# Patient Record
Sex: Female | Born: 1960 | Race: Black or African American | Hispanic: No | Marital: Single | Smoking: Current every day smoker
Health system: Southern US, Community
[De-identification: ages and names within clinical notes are randomized; demographics above are authoritative.]

## PROBLEM LIST (undated history)

## (undated) HISTORY — PX: APPENDECTOMY: SHX54

## (undated) HISTORY — PX: OVARY SURGERY: SHX727

## (undated) HISTORY — PX: SPLENECTOMY, PARTIAL: SHX787

---

## 2010-01-23 ENCOUNTER — Emergency Department (HOSPITAL_COMMUNITY): Admission: EM | Admit: 2010-01-23 | Discharge: 2010-01-23 | Payer: Self-pay | Admitting: Emergency Medicine

## 2011-11-12 ENCOUNTER — Encounter (HOSPITAL_COMMUNITY): Payer: Self-pay | Admitting: *Deleted

## 2011-11-12 ENCOUNTER — Emergency Department (INDEPENDENT_AMBULATORY_CARE_PROVIDER_SITE_OTHER): Payer: Self-pay

## 2011-11-12 ENCOUNTER — Emergency Department (INDEPENDENT_AMBULATORY_CARE_PROVIDER_SITE_OTHER)
Admission: EM | Admit: 2011-11-12 | Discharge: 2011-11-12 | Disposition: A | Discharge status: Home / Self Care | Payer: Self-pay | Source: Home / Self Care | Attending: Family Medicine | Admitting: Family Medicine

## 2011-11-12 DIAGNOSIS — J40 Bronchitis, not specified as acute or chronic: Secondary | ICD-10-CM

## 2011-11-12 MED ORDER — GUAIFENESIN-CODEINE 100-10 MG/5ML PO SYRP: 5.0000 mL | ORAL_SOLUTION | Freq: Four times a day (QID) | ORAL | Status: AC | PRN

## 2011-11-12 MED ORDER — TRIAMCINOLONE ACETONIDE 0.1 % EX OINT: TOPICAL_OINTMENT | Freq: Two times a day (BID) | CUTANEOUS | Status: AC

## 2011-11-12 MED ORDER — AZITHROMYCIN 250 MG PO TABS: 250.0000 mg | ORAL_TABLET | Freq: Every day | ORAL | Status: AC

## 2011-11-12 MED ORDER — ALBUTEROL SULFATE HFA 108 (90 BASE) MCG/ACT IN AERS: 2.0000 | INHALATION_SPRAY | RESPIRATORY_TRACT | Status: AC | PRN

## 2011-11-12 NOTE — ED Notes (Signed)
Pt  Reports  Symptoms  Of  Cough  /  Congested          Wheezing  Symptoms     Pain in her  Chest  Associated  With the  Coughing  -  She       Reports    Eyes          Are        Burning           As  Well    As  A  Runny  Nose          She  Is  Masked  And  Is  In a  Private  Room

## 2011-11-12 NOTE — Discharge Instructions (Signed)
Use albuterol every 4 to 6 hours for the next 24 to 48 hours, then as needed. Take antibiotics as directed, only if no improvement over the next 48 to 72 hours, or if fever returns. Use cough syrup as needed and as directed. I recommend aggressive fever control with acetaminophen (Tylenol) and/or ibuprofen. You may use these together, alternating them every 4 hours, or individually, every 8 hours. For example, take acetaminophen 500 to 1000 mg at 12 noon, then 600 to 800 mg of ibuprofen at 4 pm, then acetaminophen at 8 pm, etc. Also, stay hydrated with clear liquids. Return to care should your symptoms not improve, or worsen in any way

## 2011-11-12 NOTE — ED Notes (Signed)
Pt. came to registration desk and told the clerk the doctor did not give her a Rx. for the red spots on her legs. [facility] told me and I discussed with Dr. Juanetta Gosling. He said he forgot and did a Rx. for Triamcinolone 1% cream. I gave the Rx. to the pt. Vassie Moselle 11/12/2011

## 2011-11-12 NOTE — ED Provider Notes (Signed)
History     CSN: [id number]  Arrival date & time 11/12/11  1427   First MD Initiated Contact with Patient 11/12/11 1502      Chief Complaint  Patient presents with  . Cough    (Consider location/radiation/quality/duration/timing/severity/associated sxs/prior treatment) HPI Comments: Cheryl Serrano presents for evaluation of persistent cough, nasal congestion, and wheezing over the last few days. She smokes less than a pack of cigarettes a day. She denies any fever. She also reports pain in her chest with coughing and deep inspiration.  Patient is a 51 y.o. female presenting with cough. The history is provided by the patient.  Cough This is a new problem. The current episode started more than 2 days ago. The problem occurs constantly. The problem has not changed since onset.The cough is productive of sputum. Associated symptoms include chest pain, rhinorrhea, shortness of breath and wheezing. She is a smoker.    History reviewed. No pertinent past medical history.  Past Surgical History  Procedure Date  . Appendectomy   . Ovary surgery   . Splenectomy, partial     History reviewed. No pertinent family history.  History  Substance Use Topics  . Smoking status: Current Everyday Smoker  . Smokeless tobacco: Not on file  . Alcohol Use: Yes    OB History    Grav Para Term Preterm Abortions TAB SAB Ect Mult Living                  Review of Systems  Constitutional: Negative.   HENT: Positive for congestion and rhinorrhea.   Eyes: Negative.   Respiratory: Positive for cough, shortness of breath and wheezing.   Cardiovascular: Positive for chest pain.  Gastrointestinal: Negative.   Genitourinary: Negative.   Musculoskeletal: Negative.   Skin: Negative.   Neurological: Negative.     Allergies  Review of patient's allergies indicates no known allergies.  Home Medications   Current Outpatient Rx  Name Route Sig Dispense Refill  . ALBUTEROL SULFATE HFA 108 (90 BASE)  MCG/ACT IN AERS Inhalation Inhale 2 puffs into the lungs every 4 (four) hours as needed for wheezing or shortness of breath. 1 Inhaler 0  . AZITHROMYCIN 250 MG PO TABS Oral Take 1 tablet (250 mg total) by mouth daily. Take two tablets on first day, then one tablet each day for four days 6 tablet 0  . GUAIFENESIN-CODEINE 100-10 MG/5ML PO SYRP Oral Take 5 mLs by mouth every 6 (six) hours as needed for cough or congestion. 120 mL 0    BP 137/81  Pulse 90  Temp(Src) 98.2 F (36.8 C) (Oral)  Resp 22  SpO2 97%  Physical Exam  Nursing note and vitals reviewed. Constitutional: She is oriented to person, place, and time. She appears well-developed and well-nourished.  HENT:  Head: Normocephalic and atraumatic.  Right Ear: Tympanic membrane normal.  Left Ear: Tympanic membrane normal.  Mouth/Throat: Uvula is midline, oropharynx is clear and moist and mucous membranes are normal.  Eyes: EOM are normal.  Neck: Normal range of motion.  Cardiovascular: Normal rate and regular rhythm.   Pulmonary/Chest: Effort normal. She has no decreased breath sounds. She has wheezes in the right middle field and the right lower field. She has no rhonchi.  Musculoskeletal: Normal range of motion.  Neurological: She is alert and oriented to person, place, and time.  Skin: Skin is warm and dry.  Psychiatric: Her behavior is normal.    ED Course  Procedures (including critical care time)  Labs Reviewed -  No data to display Dg Chest 2 View  11/12/2011  *RADIOLOGY REPORT*  Clinical Data:  Cough, shortness of breath and tobacco use.  CHEST - 2 VIEW  Comparison: None  Findings: The heart size and mediastinal contours are within normal limits.  Both lungs are clear.  The visualized skeletal structures are unremarkable.  IMPRESSION: No active disease.  Original Report Authenticated By: Reola Calkins, M.D.     1. Bronchitis       MDM  Xray reviewed by radiologist and myself; no pneumonia; given rx for  albuterol, guaifenesin AC, and delayed rx for azithromycin        Renaee Munda, MD 11/12/11 1734

## 2017-03-02 ENCOUNTER — Emergency Department (HOSPITAL_COMMUNITY)
Admission: EM | Admit: 2017-03-02 | Discharge: 2017-03-02 | Disposition: A | Discharge status: Home / Self Care | Payer: Self-pay | Attending: Emergency Medicine | Admitting: Emergency Medicine

## 2017-03-02 ENCOUNTER — Encounter (HOSPITAL_COMMUNITY): Payer: Self-pay

## 2017-03-02 DIAGNOSIS — R05 Cough: Secondary | ICD-10-CM | POA: Insufficient documentation

## 2017-03-02 DIAGNOSIS — F17200 Nicotine dependence, unspecified, uncomplicated: Secondary | ICD-10-CM | POA: Insufficient documentation

## 2017-03-02 DIAGNOSIS — J302 Other seasonal allergic rhinitis: Secondary | ICD-10-CM | POA: Insufficient documentation

## 2017-03-02 DIAGNOSIS — R0981 Nasal congestion: Secondary | ICD-10-CM | POA: Insufficient documentation

## 2017-03-02 DIAGNOSIS — Z79899 Other long term (current) drug therapy: Secondary | ICD-10-CM | POA: Insufficient documentation

## 2017-03-02 MED ORDER — FLUTICASONE PROPIONATE 50 MCG/ACT NA SUSP: 2.0000 | Freq: Every day | NASAL | 0 refills | Status: AC

## 2017-03-02 NOTE — ED Triage Notes (Signed)
Patient complains of coughing all night x 4 nights. States that she is coughing up sputum and now has developed sore throat and right ear pain. Cough resolves during the day. Low grade fever per patient, NAD

## 2017-03-02 NOTE — Discharge Instructions (Signed)
Use Flonase nasal spray for 2 or 3 weeks to improve your discomfort.  For cough congestion, use Robitussin-DM.  You can also consider taking an antihistamine such as Claritin to improve your discomfort.

## 2017-03-02 NOTE — ED Provider Notes (Signed)
[facility]-EMERGENCY DEPT Provider Note   CSN: [id number] Arrival date & time: 03/02/17  1419   By signing my name below, I, Cheryl Serrano, attest that this documentation has been prepared under the direction and in the presence of Cheryl Bale, MD. Electronically signed, Cheryl Serrano, ED Scribe. 03/02/17. 3:04 PM.  History   Chief Complaint Chief Complaint  Patient presents with  . cough/congestion/ sore throat   The history is provided by the patient and medical records. No language interpreter was used.    Cheryl Serrano is a 56 y.o. female presenting to the Emergency Department concerning sore throat x 3 days. Associated cough, rhinorrhea, postnasal drip, congestion, eye pain, ear pain onset this morning, sinus pressure and pain and headache. She also reports low grade fevers at home. Cough worse at night. Pt states she has taken robitussin and other OTC cough medications without relief. No daily medications or chronic health disorders, per pt. 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 No vomiting. No other complaints at this time.   History reviewed. No pertinent past medical history.  There are no active problems to display for this patient.   Past Surgical History:  Procedure Laterality Date  . APPENDECTOMY    . OVARY SURGERY    . SPLENECTOMY, PARTIAL      OB History    No data available       Home Medications    Prior to Admission medications   Medication Sig Start Date End Date Taking? Authorizing Provider  albuterol (PROVENTIL HFA;VENTOLIN HFA) 108 (90 BASE) MCG/ACT inhaler Inhale 2 puffs into the lungs every 4 (four) hours as needed for wheezing or shortness of breath. 11/12/11 11/11/12  Josefina Do., MD  fluticasone (FLONASE) 50 MCG/ACT nasal spray Place 2 sprays into both nostrils daily. 03/02/17   Cheryl Bale, MD    Family History No family history on file.  Social History Social History  Substance Use Topics  . Smoking  status: Current Every Day Smoker  . Smokeless tobacco: Never Used  . Alcohol use Yes     Allergies   Patient has no known allergies.   Review of Systems Review of Systems  Constitutional: Positive for fever (low grade). Negative for chills.  HENT: Positive for congestion, ear pain, postnasal drip, sinus pain, sinus pressure and sore throat. Negative for trouble swallowing.   Eyes: Positive for pain.  Respiratory: Positive for cough.   Neurological: Positive for headaches.  All other systems reviewed and are negative.    Physical Exam Updated Vital Signs BP (!) 150/100   Pulse 66   Temp 97.7 F (36.5 C) (Oral)   Resp 18   SpO2 98%   Physical Exam  Constitutional: She is oriented to person, place, and time. She appears well-developed and well-nourished.  HENT:  Head: Normocephalic and atraumatic.  Eyes: Pupils are equal, round, and reactive to light. Conjunctivae and EOM are normal.  Neck: Normal range of motion and phonation normal. Neck supple.  Cardiovascular: Normal rate and regular rhythm.   Pulmonary/Chest: Effort normal and breath sounds normal. She exhibits no tenderness.  Abdominal: Soft. She exhibits no distension. There is no tenderness. There is no guarding.  Musculoskeletal: Normal range of motion.  Neurological: She is alert and oriented to person, place, and time. She exhibits normal muscle tone.  Skin: Skin is warm and dry.  Psychiatric: She has a normal mood and affect. Her behavior is normal. Judgment and thought content normal.  Nursing  note and vitals reviewed.    ED Treatments / Results  DIAGNOSTIC STUDIES: Oxygen Saturation is 98% on RA, NL by my interpretation.    COORDINATION OF CARE: 3:02 PM-Discussed next steps with pt. Pt verbalized understanding and is agreeable with the plan. Pt prepared for d/c, advised of symptomatic care at home, and return precautions.    Labs (all labs ordered are listed, but only abnormal results are  displayed) Labs Reviewed - No data to display  EKG  EKG Interpretation None       Radiology No results found.  Procedures Procedures (including critical care time)  Medications Ordered in ED Medications - No data to display   Initial Impression / Assessment and Plan / ED Course  I have reviewed the triage vital signs and the nursing notes.  Pertinent labs & imaging results that were available during my care of the patient were reviewed by me and considered in my medical decision making (see chart for details).      Patient Vitals for the past 24 hrs:  BP Temp Temp src Pulse Resp SpO2  03/02/17 1428 (!) 150/100 97.7 F (36.5 C) Oral 66 18 98 %    Discharge- reevaluation with update and discussion. After initial assessment and treatment, an updated evaluation reveals no change in clinical status findings discussed with the patient and all questions were answered. Cheryl Serrano L    Medical screening examination/treatment/procedure(s) were performed by non-physician practitioner and as supervising physician I was immediately available for consultation/collaboration.   EKG Interpretation None       Final Clinical Impressions(s) / ED Diagnoses   Final diagnoses:  Seasonal allergic rhinitis, unspecified trigger   Evaluation consistent with allergic rhinitis with secondary drainage.  Doubt bacterial sinusitis or metabolic instability.  Nursing Notes Reviewed/ Care Coordinated Applicable Imaging Reviewed Interpretation of Laboratory Data incorporated into ED treatment  The patient appears reasonably screened and/or stabilized for discharge and I doubt any other medical condition or other [facility] requiring further screening, evaluation, or treatment in the ED at this time prior to discharge.  Plan: Home Medications-OTC analgesia; Home Treatments-rest, fluids; return here if the recommended treatment, does not improve the symptoms; Recommended follow up-PCP, as  needed  New Prescriptions Discharge Medication List as of 03/02/2017  3:08 PM    START taking these medications   Details  fluticasone (FLONASE) 50 MCG/ACT nasal spray Place 2 sprays into both nostrils daily., Starting Sun 03/02/2017, Print      I personally performed the services described in this documentation, which was scribed in my presence. The recorded information has been reviewed and is accurate.       Cheryl BaleWentz, Yacob Wilkerson, MD 03/02/17 [phone]

## 2017-03-02 NOTE — ED Triage Notes (Signed)
CN in Pt room at Pt request.

## 2017-03-02 NOTE — ED Notes (Signed)
Declined W/C at D/C and was escorted to lobby by RN. 

## 2018-04-27 ENCOUNTER — Emergency Department (HOSPITAL_COMMUNITY)
Admission: EM | Admit: 2018-04-27 | Discharge: 2018-04-27 | Disposition: A | Discharge status: Home / Self Care | Payer: Self-pay | Attending: Emergency Medicine | Admitting: Emergency Medicine

## 2018-04-27 ENCOUNTER — Other Ambulatory Visit: Payer: Self-pay

## 2018-04-27 ENCOUNTER — Encounter (HOSPITAL_COMMUNITY): Payer: Self-pay | Admitting: Emergency Medicine

## 2018-04-27 DIAGNOSIS — F17200 Nicotine dependence, unspecified, uncomplicated: Secondary | ICD-10-CM | POA: Insufficient documentation

## 2018-04-27 DIAGNOSIS — L259 Unspecified contact dermatitis, unspecified cause: Secondary | ICD-10-CM | POA: Insufficient documentation

## 2018-04-27 DIAGNOSIS — Z79899 Other long term (current) drug therapy: Secondary | ICD-10-CM | POA: Insufficient documentation

## 2018-04-27 MED ORDER — PREDNISONE 20 MG PO TABS: ORAL_TABLET | ORAL | 0 refills | Status: AC

## 2018-04-27 MED ORDER — HYDROXYZINE HCL 25 MG PO TABS: 25.0000 mg | ORAL_TABLET | Freq: Four times a day (QID) | ORAL | 0 refills | Status: AC

## 2018-04-27 MED ORDER — PREDNISONE 20 MG PO TABS
60.0000 mg | ORAL_TABLET | Freq: Once | ORAL | Status: AC
  Administered 2018-04-27: 60 mg via ORAL
  Filled 2018-04-27: qty 3

## 2018-04-27 NOTE — ED Triage Notes (Signed)
Pt to ER for evaluation of rash to bilateral arms and trunk. States itches. States was around a cat 2 days ago and believes that was the problem. States benadryl cream relieves it.

## 2018-04-27 NOTE — Discharge Instructions (Signed)
Please read and follow all provided instructions.  Your diagnoses today include:  1. Contact dermatitis, unspecified contact dermatitis type, unspecified trigger     Tests performed today include:  Vital signs. See below for your results today.   Medications prescribed:   Prednisone - steroid medicine   It is best to take this medication in the morning to prevent sleeping problems. If you are diabetic, monitor your blood sugar closely and stop taking Prednisone if blood sugar is over 300. Take with food to prevent stomach upset.    Hydroxyzine - antihistamine  You can find this medication over-the-counter.   This medication will make you drowsy. DO NOT drive or perform any activities that require you to be awake and alert if taking this.  Take any prescribed medications only as directed.  Home care instructions:   Follow any educational materials contained in this packet  Follow-up instructions: Please follow-up with your primary care provider in the next 3 days for further evaluation of your symptoms.   Return instructions:   Please return to the Emergency Department if you experience worsening symptoms.   Call 9-1-1 immediately if you have an allergic reaction that involves your lips, mouth, throat or if you have any difficulty breathing. This is a life-threatening emergency.   Please return if you have any other emergent concerns.  Additional Information:  Your vital signs today were: BP (!) 146/95 (BP Location: Right Arm)    Pulse (!) 107    Temp 97.9 F (36.6 C) (Oral)    Resp 18    SpO2 100%  If your blood pressure (BP) was elevated above 135/85 this visit, please have this repeated by your doctor within one month. --------------

## 2018-04-27 NOTE — ED Provider Notes (Signed)
[facility] EMERGENCY DEPARTMENT Provider Note   CSN: [id number] Arrival date & time: 04/27/18  1415     History   Chief Complaint Chief Complaint  Patient presents with  . Rash    HPI Cheryl Serrano is a 57 y.o. female.  Patient presents with 9 days of generalized rash that is itchy over her entire body.  Patient states that she has had several exposures including to a cat and also cologone that was given to her by a friend.  Patient sprayed it over her body and subsequently developed a rash.  She denies any fevers or tick bites.  No facial or lip swelling.  She has applied topical Benadryl that helps some.  Today she had drainage from an area on the left posterior neck and also her right axilla.     History reviewed. No pertinent past medical history.  There are no active problems to display for this patient.   Past Surgical History:  Procedure Laterality Date  . APPENDECTOMY    . OVARY SURGERY    . SPLENECTOMY, PARTIAL       OB History   None      Home Medications    Prior to Admission medications   Medication Sig Start Date End Date Taking? Authorizing Provider  albuterol (PROVENTIL HFA;VENTOLIN HFA) 108 (90 BASE) MCG/ACT inhaler Inhale 2 puffs into the lungs every 4 (four) hours as needed for wheezing or shortness of breath. 11/12/11 11/11/12  Josefina Do., MD  fluticasone (FLONASE) 50 MCG/ACT nasal spray Place 2 sprays into both nostrils daily. 03/02/17   Mancel Bale, MD  hydrOXYzine (ATARAX/VISTARIL) 25 MG tablet Take 1 tablet (25 mg total) by mouth every 6 (six) hours. 04/27/18   Renne Crigler, PA-C  predniSONE (DELTASONE) 20 MG tablet 3 Tabs PO Days 1-3, then 2 tabs PO Days 4-6, then 1 tab PO Day 7-9, then Half Tab PO Day 10-12 04/27/18   Renne Crigler, PA-C    Family History History reviewed. No pertinent family history.  Social History Social History   Tobacco Use  . Smoking status: Current Every Day Smoker  . Smokeless  tobacco: Never Used  Substance Use Topics  . Alcohol use: Yes  . Drug use: Not on file     Allergies   Patient has no known allergies.   Review of Systems Review of Systems  Constitutional: Negative for fever.  HENT: Negative for facial swelling and trouble swallowing.   Eyes: Negative for redness.  Respiratory: Negative for shortness of breath, wheezing and stridor.   Cardiovascular: Negative for chest pain.  Gastrointestinal: Negative for nausea and vomiting.  Musculoskeletal: Negative for myalgias.  Skin: Positive for rash.  Neurological: Negative for light-headedness.  Psychiatric/Behavioral: Negative for confusion.     Physical Exam Updated Vital Signs BP (!) 146/95 (BP Location: Right Arm)   Pulse (!) 107   Temp 97.9 F (36.6 C) (Oral)   Resp 18   SpO2 100%   Physical Exam  Constitutional: She appears well-developed and well-nourished.  HENT:  Head: Normocephalic and atraumatic.  No intraoral lesions noted.  Eyes: Conjunctivae are normal. Right eye exhibits no discharge. Left eye exhibits no discharge.  Neck: Normal range of motion. Neck supple.  Cardiovascular: Normal rate, regular rhythm and normal heart sounds.  Pulmonary/Chest: Effort normal and breath sounds normal.  Abdominal: Soft. There is no tenderness.  Neurological: She is alert. Sensory deficit:   Skin: Skin is warm and dry.  Patient with generalized  maculopapular rash with coalescence of the left posterior neck and right axilla with serous drainage.  Psychiatric: She has a normal mood and affect.  Nursing note and vitals reviewed.    ED Treatments / Results  Labs (all labs ordered are listed, but only abnormal results are displayed) Labs Reviewed - No data to display  EKG None  Radiology No results found.  Procedures Procedures (including critical care time)  Medications Ordered in ED Medications  predniSONE (DELTASONE) tablet 60 mg (has no administration in time range)      Initial Impression / Assessment and Plan / ED Course  I have reviewed the triage vital signs and the nursing notes.  Pertinent labs & imaging results that were available during my care of the patient were reviewed by me and considered in my medical decision making (see chart for details).     Patient seen and examined.   Vital signs reviewed and are as follows: BP (!) 146/95 (BP Location: Right Arm)   Pulse (!) 107   Temp 97.9 F (36.6 C) (Oral)   Resp 18   SpO2 100%   No signs of significant allergic reaction or anaphylaxis.  Exam is most consistent with a generalized contact dermatitis.  No signs of infection.  No recent tick bites.  Patient otherwise appears well.  We will start on tapered course of prednisone and hydroxyzine for symptom relief.  Discussed careful use of hydroxyzine given its sedation effects.  Encourage patient to follow-up with PCP as needed for further evaluation and testing.  Final Clinical Impressions(s) / ED Diagnoses   Final diagnoses:  Contact dermatitis, unspecified contact dermatitis type, unspecified trigger   Patient with generalized dermatitis.  No signs of infection.  Treatment as above.  No history of diabetes.   ED Discharge Orders         Ordered    predniSONE (DELTASONE) 20 MG tablet     04/27/18 1622    hydrOXYzine (ATARAX/VISTARIL) 25 MG tablet  Every 6 hours     04/27/18 1622           Renne CriglerGeiple, Kortny Lirette, PA-C 04/27/18 1629    Raeford RazorKohut, Stephen, MD 04/28/18 1531

## 2021-06-15 ENCOUNTER — Emergency Department (HOSPITAL_COMMUNITY)
Admission: EM | Admit: 2021-06-15 | Discharge: 2021-06-15 | Disposition: A | Discharge status: Home / Self Care | Payer: Commercial Managed Care - PPO | Attending: Emergency Medicine | Admitting: Emergency Medicine

## 2021-06-15 ENCOUNTER — Emergency Department (HOSPITAL_COMMUNITY): Payer: Commercial Managed Care - PPO

## 2021-06-15 ENCOUNTER — Other Ambulatory Visit: Payer: Self-pay

## 2021-06-15 ENCOUNTER — Encounter (HOSPITAL_COMMUNITY): Payer: Self-pay

## 2021-06-15 ENCOUNTER — Ambulatory Visit (HOSPITAL_COMMUNITY)
Admission: EM | Admit: 2021-06-15 | Discharge: 2021-06-15 | Disposition: A | Discharge status: Home / Self Care | Payer: Self-pay

## 2021-06-15 DIAGNOSIS — S80812A Abrasion, left lower leg, initial encounter: Secondary | ICD-10-CM | POA: Diagnosis not present

## 2021-06-15 DIAGNOSIS — Z23 Encounter for immunization: Secondary | ICD-10-CM | POA: Diagnosis not present

## 2021-06-15 DIAGNOSIS — J069 Acute upper respiratory infection, unspecified: Secondary | ICD-10-CM | POA: Diagnosis not present

## 2021-06-15 DIAGNOSIS — S81852A Open bite, left lower leg, initial encounter: Secondary | ICD-10-CM | POA: Insufficient documentation

## 2021-06-15 DIAGNOSIS — F17200 Nicotine dependence, unspecified, uncomplicated: Secondary | ICD-10-CM | POA: Diagnosis not present

## 2021-06-15 DIAGNOSIS — W540XXA Bitten by dog, initial encounter: Secondary | ICD-10-CM | POA: Diagnosis not present

## 2021-06-15 DIAGNOSIS — Z20822 Contact with and (suspected) exposure to covid-19: Secondary | ICD-10-CM | POA: Diagnosis not present

## 2021-06-15 DIAGNOSIS — J3489 Other specified disorders of nose and nasal sinuses: Secondary | ICD-10-CM | POA: Insufficient documentation

## 2021-06-15 LAB — RESP PANEL BY RT-PCR (FLU A&B, COVID) ARPGX2
Influenza A by PCR: NEGATIVE
Influenza B by PCR: NEGATIVE
SARS Coronavirus 2 by RT PCR: NEGATIVE

## 2021-06-15 MED ORDER — BENZONATATE 100 MG PO CAPS: 100.0000 mg | ORAL_CAPSULE | Freq: Three times a day (TID) | ORAL | 0 refills | Status: AC | PRN

## 2021-06-15 MED ORDER — AMOXICILLIN-POT CLAVULANATE 875-125 MG PO TABS: 1.0000 | ORAL_TABLET | Freq: Two times a day (BID) | ORAL | 0 refills | Status: AC

## 2021-06-15 MED ORDER — TETANUS-DIPHTH-ACELL PERTUSSIS 5-2.5-18.5 LF-MCG/0.5 IM SUSY
0.5000 mL | PREFILLED_SYRINGE | Freq: Once | INTRAMUSCULAR | Status: AC
  Administered 2021-06-15: 0.5 mL via INTRAMUSCULAR
  Filled 2021-06-15: qty 0.5

## 2021-06-15 NOTE — ED Notes (Signed)
Pt was told, she needs to started the Rabies series at the ED, as pt do not know the vaccine statues of the dog. Pt verbalized understanding and agreed.

## 2021-06-15 NOTE — ED Triage Notes (Signed)
Pt was bit by a dog on her left lower leg by her neighbors dog. Unknown vaccinations.   Pt also requesting to be seen for her cough and nasal congestion that's been going on since yesterday.

## 2021-06-15 NOTE — ED Provider Notes (Signed)
[facility] EMERGENCY DEPARTMENT Provider Note   CSN: [id number] Arrival date & time: 06/15/21  1241     History Chief Complaint  Patient presents with   Animal Bite   Cough    Cheryl Serrano is a 60 y.o. female.  HPI Patient is a 60 year old female who presents to the emergency department with multiple complaints.  Patient states that this morning she was bit by her neighbors pitbull along the left shin.  States that she was wearing thick pants and has two small abrasions in the region.  No active bleeding at this time.  No numbness or weakness.  No other regions of pain.  She spoke to her neighbor that owns the dog and was told that it is up-to-date on his immunizations.  Patient also notes cough and congestion for the past 2 to 3 days.  States her cough is productive with green mucus.  Reports associated rhinorrhea.  No chest pain, shortness of breath, nausea, vomiting, fevers.  States that she smokes 1 cigarette/day.    History reviewed. No pertinent past medical history.  There are no problems to display for this patient.   Past Surgical History:  Procedure Laterality Date   APPENDECTOMY     OVARY SURGERY     SPLENECTOMY, PARTIAL       OB History   No obstetric history on file.     No family history on file.  Social History   Tobacco Use   Smoking status: Every Day   Smokeless tobacco: Never  Substance Use Topics   Alcohol use: Yes    Home Medications Prior to Admission medications   Medication Sig Start Date End Date Taking? Authorizing Provider  amoxicillin-clavulanate (AUGMENTIN) 875-125 MG tablet Take 1 tablet by mouth every 12 (twelve) hours for 7 days. 06/15/21 06/22/21 Yes Placido Sou, PA-C  benzonatate (TESSALON) 100 MG capsule Take 1 capsule (100 mg total) by mouth 3 (three) times daily as needed for cough. 06/15/21  Yes Placido Sou, PA-C  albuterol (PROVENTIL HFA;VENTOLIN HFA) 108 (90 BASE) MCG/ACT inhaler Inhale 2  puffs into the lungs every 4 (four) hours as needed for wheezing or shortness of breath. 11/12/11 11/11/12  Josefina Do., MD  fluticasone (FLONASE) 50 MCG/ACT nasal spray Place 2 sprays into both nostrils daily. 03/02/17   Mancel Bale, MD  hydrOXYzine (ATARAX/VISTARIL) 25 MG tablet Take 1 tablet (25 mg total) by mouth every 6 (six) hours. 04/27/18   Renne Crigler, PA-C  predniSONE (DELTASONE) 20 MG tablet 3 Tabs PO Days 1-3, then 2 tabs PO Days 4-6, then 1 tab PO Day 7-9, then Half Tab PO Day 10-12 04/27/18   Renne Crigler, PA-C    Allergies    Patient has no known allergies.  Review of Systems   Review of Systems  All other systems reviewed and are negative. Ten systems reviewed and are negative for acute change, except as noted in the HPI.   Physical Exam Updated Vital Signs BP (!) 199/93 (BP Location: Right Arm)   Pulse 73   Temp 98.1 F (36.7 C) (Oral)   Resp 17   SpO2 100%   Physical Exam Vitals and nursing note reviewed.  Constitutional:      General: She is not in acute distress.    Appearance: Normal appearance. She is not ill-appearing, toxic-appearing or diaphoretic.  HENT:     Head: Normocephalic and atraumatic.     Right Ear: External ear normal.     Left Ear:  External ear normal.     Nose: Nose normal.     Mouth/Throat:     Mouth: Mucous membranes are moist.     Pharynx: Oropharynx is clear. No oropharyngeal exudate or posterior oropharyngeal erythema.  Eyes:     General: No scleral icterus.       Right eye: No discharge.        Left eye: No discharge.     Extraocular Movements: Extraocular movements intact.     Conjunctiva/sclera: Conjunctivae normal.  Cardiovascular:     Rate and Rhythm: Normal rate and regular rhythm.     Pulses: Normal pulses.     Heart sounds: Normal heart sounds. No murmur heard.   No friction rub. No gallop.     Comments: RRR w/o M/R/G. Pulmonary:     Effort: Pulmonary effort is normal. No respiratory distress.     Breath  sounds: Normal breath sounds. No stridor. No wheezing, rhonchi or rales.     Comments: LCTAB w/o wheezing, rales or rhonchi. Abdominal:     General: Abdomen is flat.     Palpations: Abdomen is soft.     Tenderness: There is no abdominal tenderness.  Musculoskeletal:        General: Normal range of motion.     Cervical back: Normal range of motion and neck supple. No tenderness.  Skin:    General: Skin is warm and dry.     Comments: 2, 2 to 3 cm abrasions noted to the left shin.  No active bleeding.  Mild tenderness overlying the site.  Distal sensation intact.  2+ pedal pulses.  Neurological:     General: No focal deficit present.     Mental Status: She is alert and oriented to person, place, and time.  Psychiatric:        Mood and Affect: Mood normal.        Behavior: Behavior normal.   ED Results / Procedures / Treatments   Labs (all labs ordered are listed, but only abnormal results are displayed) Labs Reviewed  RESP PANEL BY RT-PCR (FLU A&B, COVID) ARPGX2   EKG None  Radiology DG Tibia/Fibula Left  Result Date: 06/15/2021 CLINICAL DATA:  animal bite EXAM: LEFT TIBIA AND FIBULA - 2 VIEW COMPARISON:  None. FINDINGS: No acute fracture or dislocation. Joint spaces and alignment are maintained. No area of erosion or osseous destruction. No unexpected radiopaque foreign body. Soft tissues are unremarkable. IMPRESSION: No acute fracture or dislocation. Electronically Signed   By: Meda Klinefelter M.D.   On: 06/15/2021 16:20    Procedures Procedures   Medications Ordered in ED Medications  Tdap (BOOSTRIX) injection 0.5 mL (0.5 mLs Intramuscular Given 06/15/21 1422)    ED Course  I have reviewed the triage vital signs and the nursing notes.  Pertinent labs & imaging results that were available during my care of the patient were reviewed by me and considered in my medical decision making (see chart for details).    MDM Rules/Calculators/A&P                           Patient is a 60 year old female who presents to the emergency department due to a dog bite to the left shin that occurred earlier this morning.  Patient also notes cough and congestion for the past 2 to 3 days.  Physical exam significant for 2 small abrasions to the left shin.  No active bleeding.  Patient states that she was  wearing thick pants at the time when she was bit.  She spoke to the owners of the dog who state that the dog is up-to-date on its vaccinations.  She states that she is also going to reach out to animal control regarding the incident.  Denies any fevers, chills, nausea, or vomiting.  NVI distal to the injury.  Will discharge on a course of Augmentin.  Tdap updated in the emergency department.  Patient also notes cough and congestion for the past 2 to 3 days.  She smokes about 1 cigarette/day.  She is afebrile.  Not tachycardic.  Lungs are clear to auscultation bilaterally.  Likely a viral URI.  Respiratory panel obtained in triage which is negative for flu A, flu B, and COVID-19.  Will discharge on a course of Tessalon Perles.  Feel that patient is stable for discharge at this time and she is agreeable.  We discussed return precautions at length.  Her questions were answered and she was amicable at the time of discharge.  Final Clinical Impression(s) / ED Diagnoses Final diagnoses:  Viral URI with cough  Dog bite, initial encounter   Rx / DC Orders ED Discharge Orders          Ordered    amoxicillin-clavulanate (AUGMENTIN) 875-125 MG tablet  Every 12 hours        06/15/21 1659    benzonatate (TESSALON) 100 MG capsule  3 times daily PRN        06/15/21 1659             Placido Sou, PA-C 06/15/21 1702    Horton, Clabe Seal, DO 06/15/21 1729

## 2021-06-15 NOTE — ED Provider Notes (Signed)
Emergency Medicine Provider Triage Evaluation Note  Cheryl Serrano , a 60 y.o. female  was evaluated in triage.  Pt complains of left leg dog bite.  Bitten earlier today by neighbors dog.  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  Tetanus is not up-to-date. Additionally, few days of nasal congestion and cough.  No fevers  Review of Systems  Positive: Congestion, cough. L leg pain Negative: fever  Physical Exam  BP (!) 199/93 (BP Location: Right Arm)   Pulse 73   Temp 98.1 F (36.7 C) (Oral)   Resp 17   SpO2 100%  Gen:   Awake, no distress   Resp:  Normal effort  MSK:   Moves extremities without difficulty  Other:  Puncture wounds of the anterior left shin with surrounding tenderness.  Medical Decision Making  Medically screening exam initiated at 2:18 PM.  Appropriate orders placed.  Machele Deihl was informed that the remainder of the evaluation will be completed by another provider, this initial triage assessment does not replace that evaluation, and the importance of remaining in the ED until their evaluation is complete.  Xray, resp panel   Alveria Apley, PA-C 06/15/21 1419    Mancel Bale, MD 06/16/21 1719

## 2021-06-15 NOTE — Discharge Instructions (Addendum)
I am prescribing you 2 medications.  The first medication is an antibiotic that you need to take twice a day for the next week.  It is called Augmentin.  Please do not stop taking this early.  This will help prevent infection in the left leg where you are bit.  The second medication is called Tessalon Perles.  This is a cough medication you can take up to 3 times a day.  Please only take it as prescribed.  If you develop any new or worsening symptoms please come back to the emergency department immediately.

## 2021-07-23 ENCOUNTER — Inpatient Hospital Stay: Payer: Self-pay | Admitting: Internal Medicine

## 2022-05-11 IMAGING — DX DG TIBIA/FIBULA 2V*L*
4 series · 4 of 4 positions shown · non-contrast
Comparison: None.

CLINICAL DATA: animal bite

EXAM:
LEFT TIBIA AND FIBULA - 2 VIEW

[tibia ap (1 of 2)]
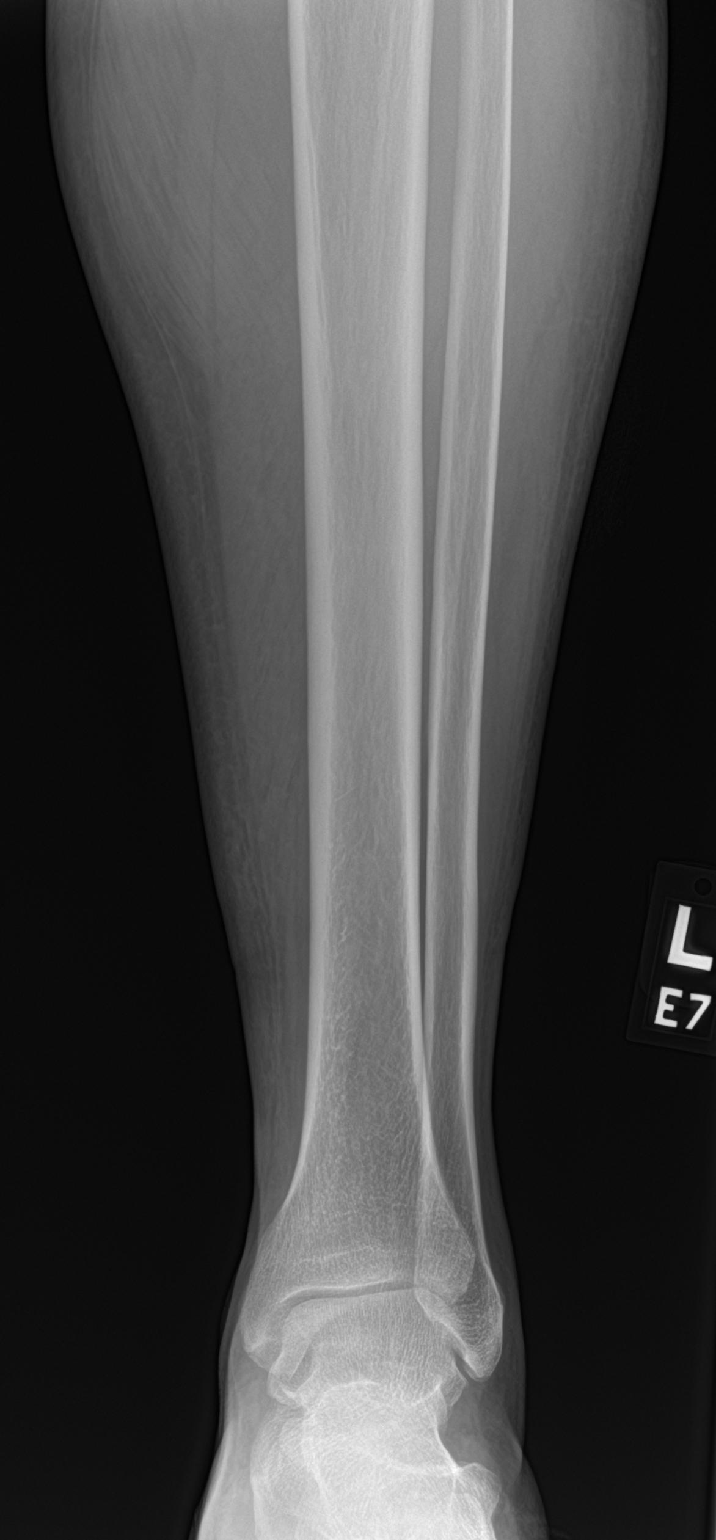

[tibia ap (2 of 2)]
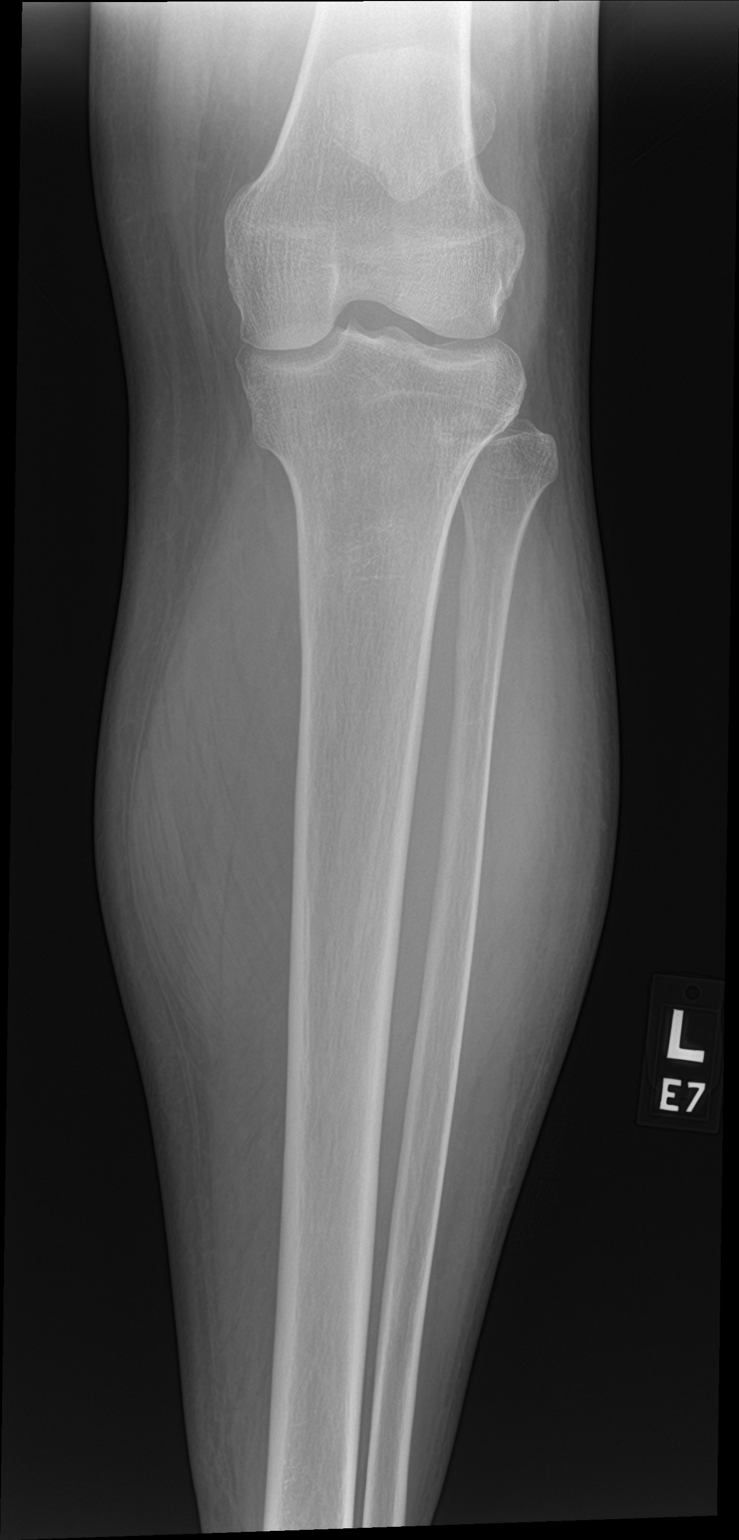

[tibia lat (1 of 2)]
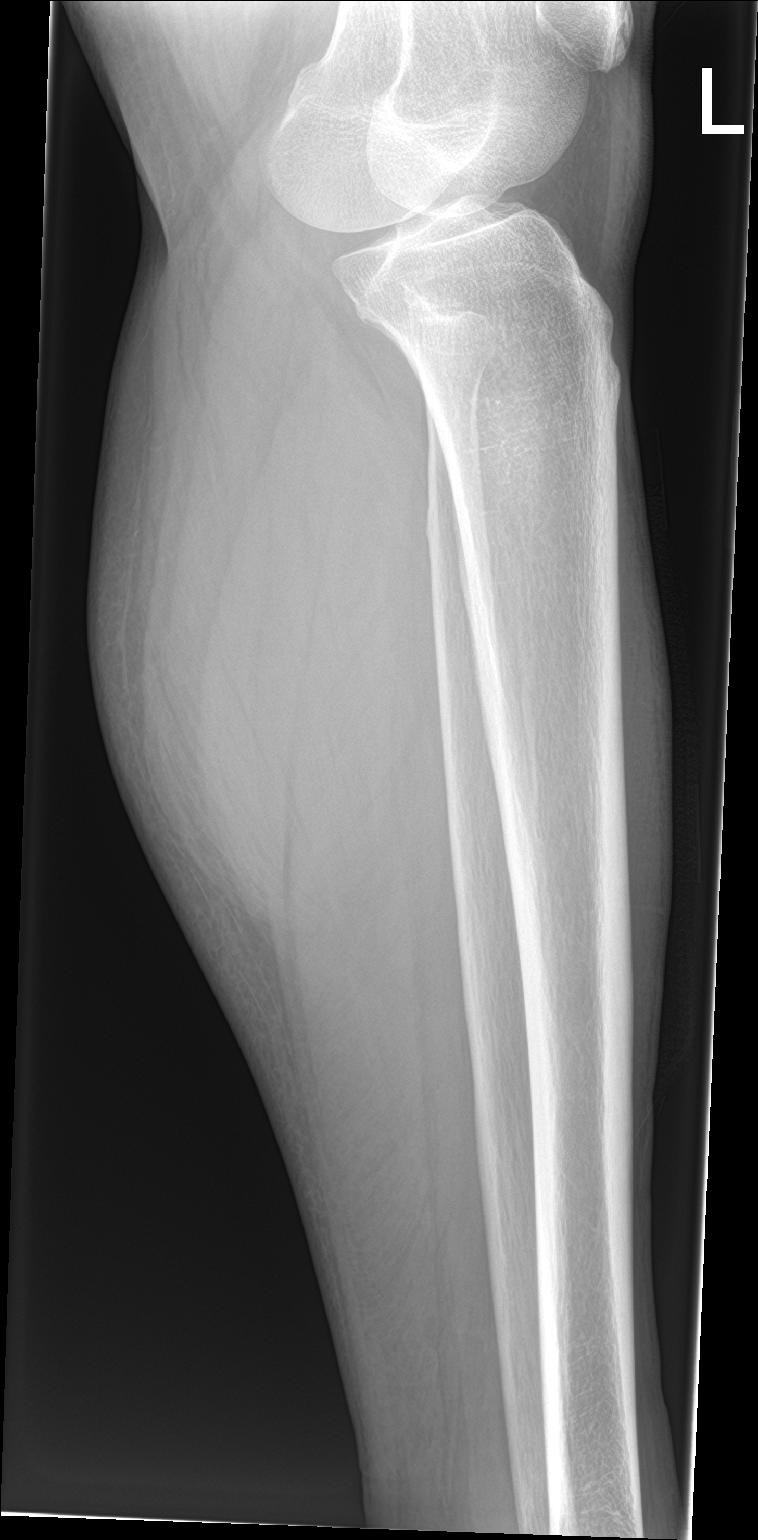

[tibia lat (2 of 2)]
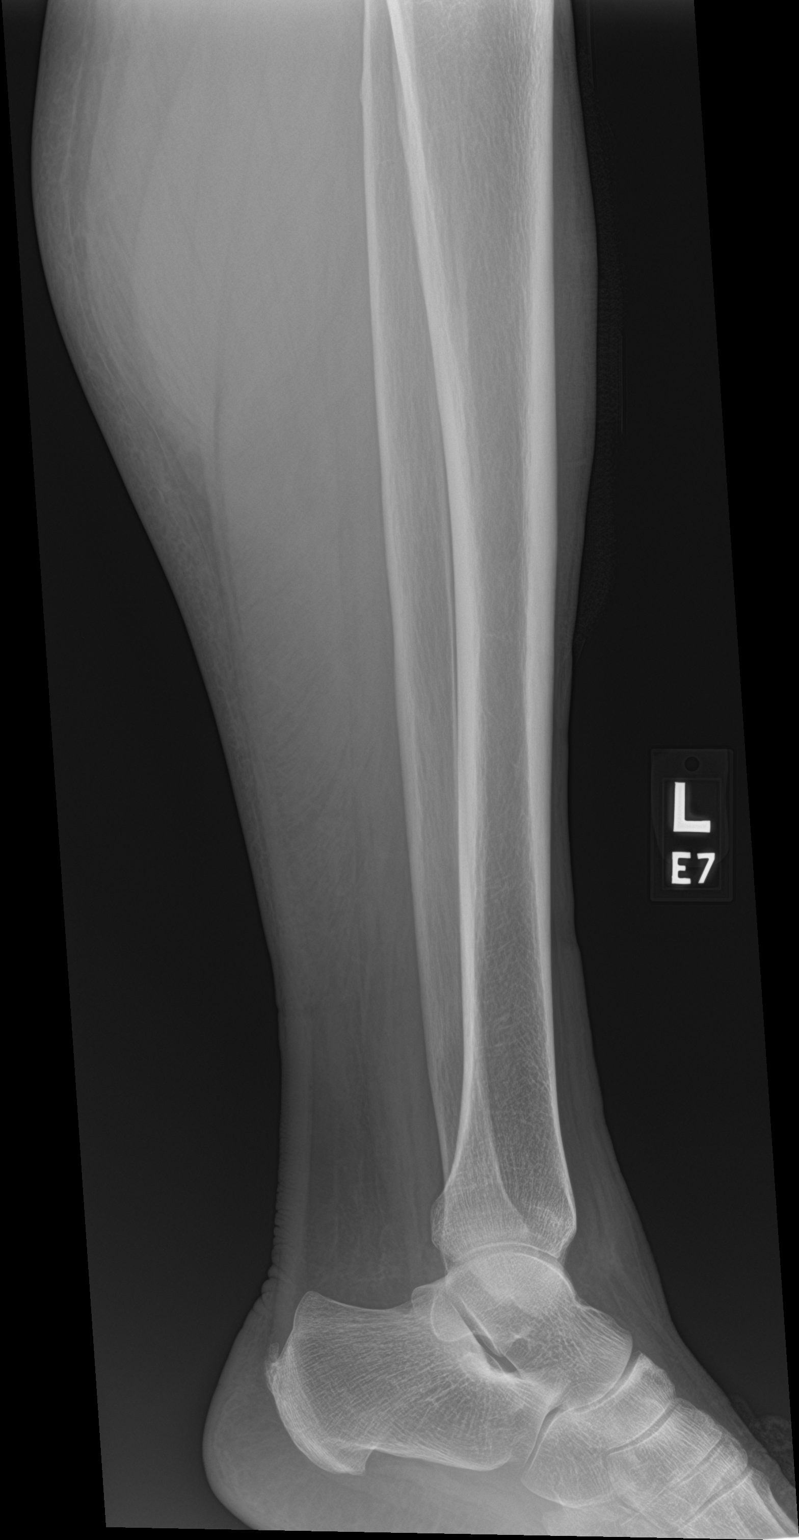

[4 of 4 positions shown; findings below may reference images not displayed]

FINDINGS: No acute fracture or dislocation. Joint spaces and alignment are
maintained. No area of erosion or osseous destruction. No unexpected
radiopaque foreign body. Soft tissues are unremarkable.
IMPRESSION: No acute fracture or dislocation.
